# Patient Record
Sex: Female | Born: 1960 | Hispanic: No | Marital: Married
Health system: Southern US, Community
[De-identification: ages and names within clinical notes are randomized; demographics above are authoritative.]

---

## 2006-11-29 ENCOUNTER — Ambulatory Visit (HOSPITAL_BASED_OUTPATIENT_CLINIC_OR_DEPARTMENT_OTHER): Admission: RE | Admit: 2006-11-29 | Discharge: 2006-11-29 | Payer: Self-pay | Admitting: Urology

## 2008-02-22 IMAGING — CR DG ABDOMEN 1V
2 series · 2 of 2 positions shown · non-contrast
Comparison: none

CLINICAL DATA: Preop workup.  Distal left ureteral stone. 
 ABDOMEN ? 1 VIEW (2 IMAGES):

[t abdomen supine (1 of 2)]
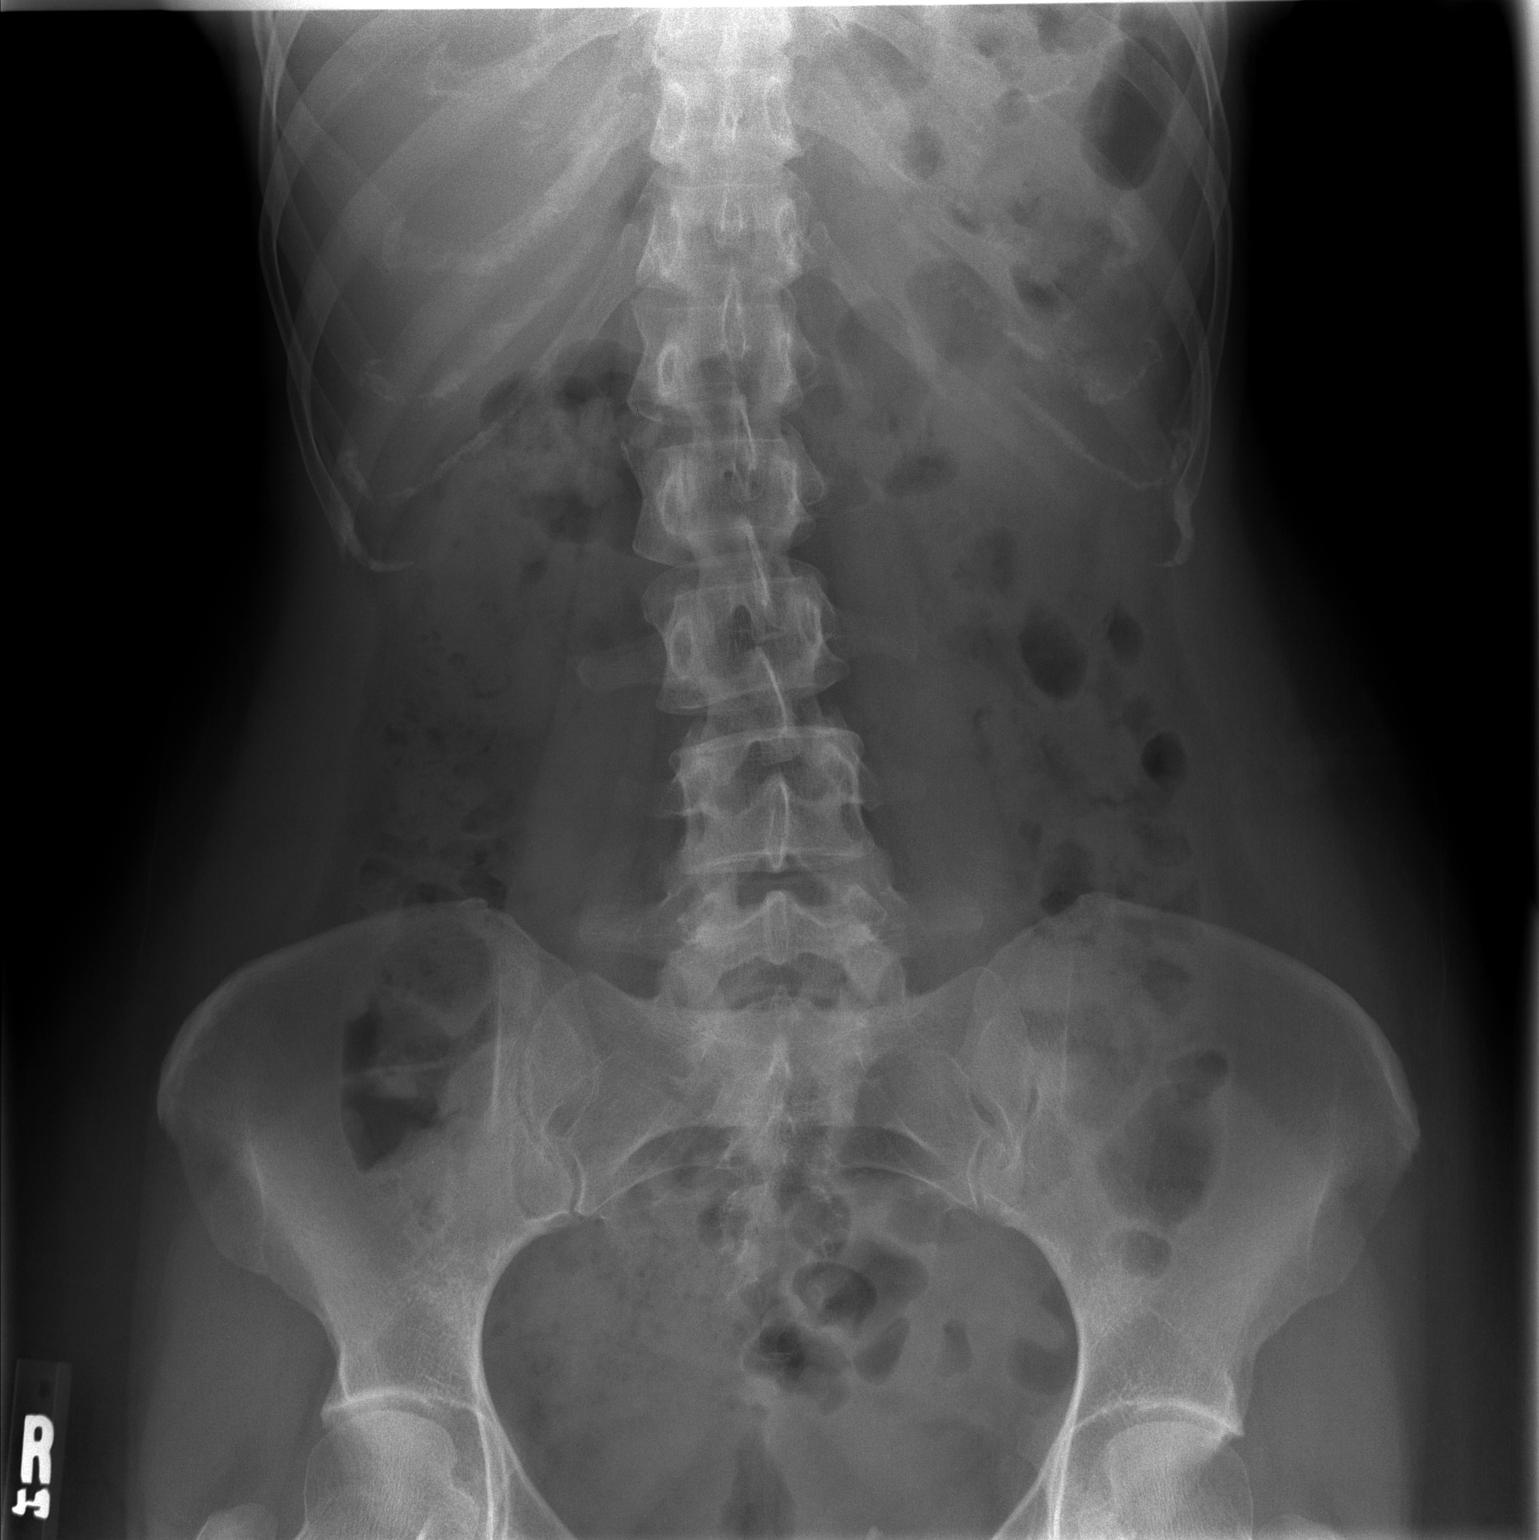

[t abdomen supine (2 of 2)]
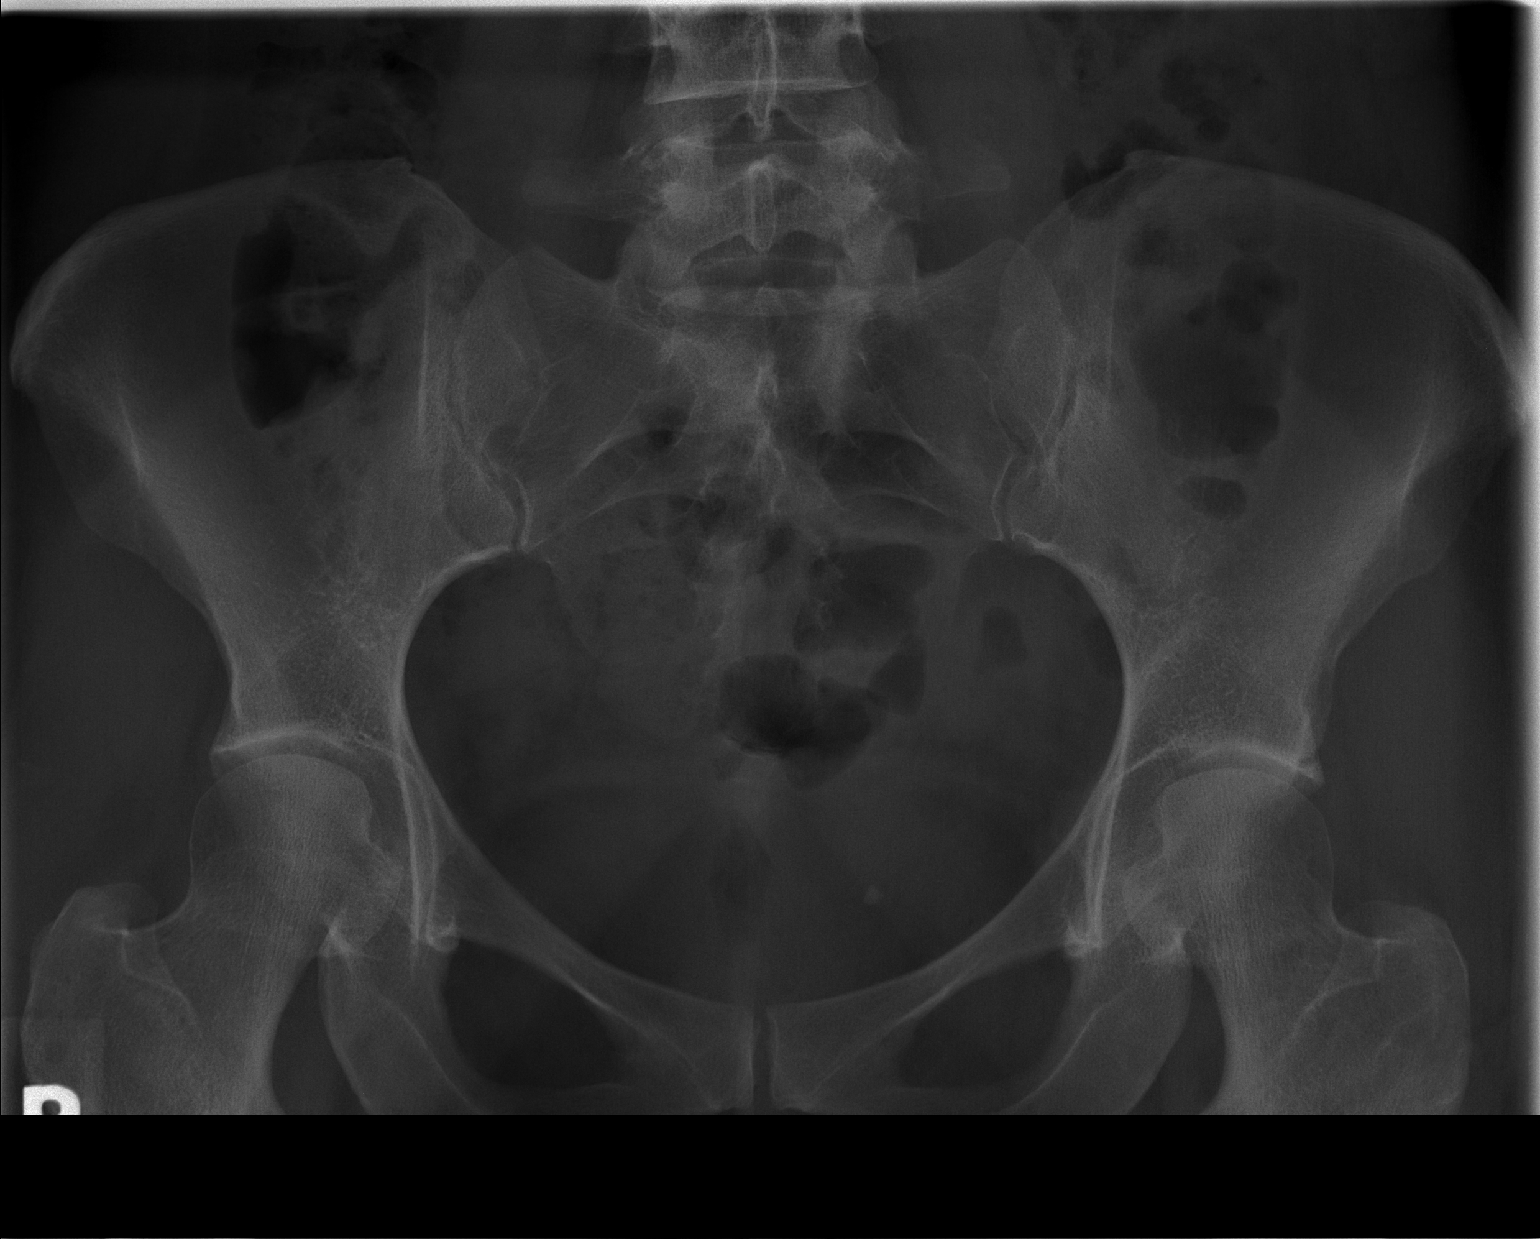

[2 of 2 positions shown; findings below may reference images not displayed]

FINDINGS: KUB and pull-down view of the pelvis were obtained.  There is an approximately 4.5 x 4.7 mm calcification in the low left true pelvis.  Bowel gas patterns are unremarkable.
IMPRESSION: Findings would be compatible with a distal left ureteral stone near the UVJ.

## 2023-03-24 ENCOUNTER — Telehealth: Payer: Self-pay | Admitting: Medical Oncology

## 2023-03-27 NOTE — Telephone Encounter (Signed)
Pain med picked up last Friday.

## 2023-03-29 ENCOUNTER — Telehealth: Payer: Self-pay | Admitting: Medical Oncology

## 2023-04-04 NOTE — Telephone Encounter (Signed)
error
